# Patient Record
Sex: Male | Born: 1975 | Race: Black or African American | Hispanic: No | Marital: Married | Smoking: Current every day smoker
Health system: Southern US, Community
[De-identification: ages and names within clinical notes are randomized; demographics above are authoritative.]

## PROBLEM LIST (undated history)

## (undated) DIAGNOSIS — E119 Type 2 diabetes mellitus without complications: Secondary | ICD-10-CM

## (undated) DIAGNOSIS — I10 Essential (primary) hypertension: Secondary | ICD-10-CM

---

## 2019-05-28 ENCOUNTER — Other Ambulatory Visit: Payer: Self-pay

## 2019-05-28 ENCOUNTER — Emergency Department: Payer: BC Managed Care – PPO

## 2019-05-28 ENCOUNTER — Emergency Department
Admission: EM | Admit: 2019-05-28 | Discharge: 2019-05-28 | Disposition: A | Discharge status: Home / Self Care | Payer: BC Managed Care – PPO | Attending: Student | Admitting: Student

## 2019-05-28 ENCOUNTER — Encounter: Payer: Self-pay | Admitting: Emergency Medicine

## 2019-05-28 DIAGNOSIS — E119 Type 2 diabetes mellitus without complications: Secondary | ICD-10-CM | POA: Diagnosis not present

## 2019-05-28 DIAGNOSIS — F17210 Nicotine dependence, cigarettes, uncomplicated: Secondary | ICD-10-CM | POA: Insufficient documentation

## 2019-05-28 DIAGNOSIS — N200 Calculus of kidney: Secondary | ICD-10-CM

## 2019-05-28 DIAGNOSIS — I10 Essential (primary) hypertension: Secondary | ICD-10-CM | POA: Diagnosis not present

## 2019-05-28 DIAGNOSIS — Z7984 Long term (current) use of oral hypoglycemic drugs: Secondary | ICD-10-CM | POA: Diagnosis not present

## 2019-05-28 DIAGNOSIS — N179 Acute kidney failure, unspecified: Secondary | ICD-10-CM | POA: Insufficient documentation

## 2019-05-28 DIAGNOSIS — R109 Unspecified abdominal pain: Secondary | ICD-10-CM | POA: Diagnosis present

## 2019-05-28 DIAGNOSIS — Z79899 Other long term (current) drug therapy: Secondary | ICD-10-CM | POA: Insufficient documentation

## 2019-05-28 HISTORY — DX: Essential (primary) hypertension: I10

## 2019-05-28 HISTORY — DX: Type 2 diabetes mellitus without complications: E11.9

## 2019-05-28 LAB — URINALYSIS, COMPLETE (UACMP) WITH MICROSCOPIC
Bilirubin Urine: NEGATIVE
Glucose, UA: NEGATIVE mg/dL
Ketones, ur: 20 mg/dL — AB
Leukocytes,Ua: NEGATIVE
Nitrite: NEGATIVE
Protein, ur: 30 mg/dL — AB
RBC / HPF: >50 RBC/hpf — ABNORMAL HIGH (ref 0–5)
Specific Gravity, Urine: 1.02 (ref 1.005–1.030)
Squamous Epithelial / HPF: NONE SEEN (ref 0–5)
pH: 5 (ref 5.0–8.0)

## 2019-05-28 LAB — COMPREHENSIVE METABOLIC PANEL
ALT: 11 U/L (ref 0–44)
AST: 16 U/L (ref 15–41)
Albumin: 4.5 g/dL (ref 3.5–5.0)
Alkaline Phosphatase: 71 U/L (ref 38–126)
Anion gap: 13 (ref 5–15)
BUN: 20 mg/dL (ref 6–20)
CO2: 27 mmol/L (ref 22–32)
Calcium: 9.7 mg/dL (ref 8.9–10.3)
Chloride: 96 mmol/L — ABNORMAL LOW (ref 98–111)
Creatinine, Ser: 2.6 mg/dL — ABNORMAL HIGH (ref 0.61–1.24)
GFR calc Af Amer: 33 mL/min — ABNORMAL LOW (ref >60)
GFR calc non Af Amer: 29 mL/min — ABNORMAL LOW (ref >60)
Glucose, Bld: 153 mg/dL — ABNORMAL HIGH (ref 70–99)
Potassium: 4.1 mmol/L (ref 3.5–5.1)
Sodium: 136 mmol/L (ref 135–145)
Total Bilirubin: 0.8 mg/dL (ref 0.3–1.2)
Total Protein: 8.9 g/dL — ABNORMAL HIGH (ref 6.5–8.1)

## 2019-05-28 LAB — CBC
HCT: 52.3 % — ABNORMAL HIGH (ref 39.0–52.0)
Hemoglobin: 17.4 g/dL — ABNORMAL HIGH (ref 13.0–17.0)
MCH: 28.3 pg (ref 26.0–34.0)
MCHC: 33.3 g/dL (ref 30.0–36.0)
MCV: 85.2 fL (ref 80.0–100.0)
Platelets: 250 10*3/uL (ref 150–400)
RBC: 6.14 MIL/uL — ABNORMAL HIGH (ref 4.22–5.81)
RDW: 13.9 % (ref 11.5–15.5)
WBC: 14.2 10*3/uL — ABNORMAL HIGH (ref 4.0–10.5)
nRBC: 0 % (ref 0.0–0.2)

## 2019-05-28 LAB — LIPASE, BLOOD: Lipase: 46 U/L (ref 11–51)

## 2019-05-28 LAB — TROPONIN I (HIGH SENSITIVITY): Troponin I (High Sensitivity): 6 ng/L (ref <18)

## 2019-05-28 MED ORDER — FENTANYL CITRATE (PF) 100 MCG/2ML IJ SOLN
50.0000 ug | Freq: Once | INTRAMUSCULAR | Status: AC
  Administered 2019-05-28: 50 ug via INTRAVENOUS
  Filled 2019-05-28: qty 2

## 2019-05-28 MED ORDER — ONDANSETRON HCL 4 MG PO TABS: 4.0000 mg | ORAL_TABLET | Freq: Three times a day (TID) | ORAL | 0 refills | Status: AC | PRN

## 2019-05-28 MED ORDER — ONDANSETRON HCL 4 MG/2ML IJ SOLN
4.0000 mg | Freq: Once | INTRAMUSCULAR | Status: AC
  Administered 2019-05-28: 4 mg via INTRAVENOUS
  Filled 2019-05-28: qty 2

## 2019-05-28 MED ORDER — OXYCODONE HCL 5 MG PO TABS: 5.0000 mg | ORAL_TABLET | Freq: Four times a day (QID) | ORAL | 0 refills | Status: AC | PRN

## 2019-05-28 MED ORDER — MORPHINE SULFATE (PF) 4 MG/ML IV SOLN
4.0000 mg | Freq: Once | INTRAVENOUS | Status: AC
  Administered 2019-05-28: 4 mg via INTRAVENOUS
  Filled 2019-05-28: qty 1

## 2019-05-28 MED ORDER — OXYCODONE HCL 5 MG PO TABS
5.0000 mg | ORAL_TABLET | Freq: Once | ORAL | Status: AC
  Administered 2019-05-28: 5 mg via ORAL
  Filled 2019-05-28: qty 1

## 2019-05-28 MED ORDER — SODIUM CHLORIDE 0.9 % IV BOLUS
2000.0000 mL | Freq: Once | INTRAVENOUS | Status: AC
  Administered 2019-05-28: 2000 mL via INTRAVENOUS

## 2019-05-28 NOTE — ED Provider Notes (Addendum)
[facility] Emergency Department Provider Note  ____________________________________________   First MD Initiated Contact with Patient 05/28/19 1546     (approximate)  I have reviewed the triage vital signs and the nursing notes.  History  Chief Complaint Abdominal Pain    HPI Bruce Brown is a 44 y.o. male with history of DM on Metformin, HTN who presents to the emergency department for left-sided abdominal pain.  Patient states symptoms first started 3 days ago. Initial onset of pain woke him from sleep and was severe and acute at onset.  Located in the LUQ, sharp.  Since then his pain has decreased, feels more like fullness or gas pain, 3/10 in severity.  Located in epigastrium and left upper quadrant and seems to radiate downwards in a curvilinear fashion into the LLQ.  Denies any fevers, dysuria, hematuria.  Has had 2 episodes of vomiting, and 2 episodes of loose stool that he attributes to a MiraLAX that he took when he was initially concerned he might be constipated.  Has been drinking small amounts of ginger ale, but otherwise not managing to take significant amount of solid foods due to his discomfort.  No chest pain or shortness of breath.  No sick contacts or known COVID exposures.  No one else with similar symptoms in the home.  No new foods.  On chart review labs just done on 05/13/2019 revealed a normal creatinine of 1.3.   Past Medical Hx Past Medical History:  Diagnosis Date  . Diabetes mellitus without complication (HCC)   . Hypertension     Problem List There are no problems to display for this patient.   Past Surgical Hx History reviewed. No pertinent surgical history.  Medications Metformin Lisinopril   Allergies Patient has no known allergies.  Family Hx No family history on file.  Social Hx Social History   Tobacco Use  . Smoking status: Current Every Day Smoker    Packs/day: 0.50    Types: Cigarettes  . Smokeless  tobacco: Never Used  Substance Use Topics  . Alcohol use: Yes  . Drug use: Never     Review of Systems  Constitutional: Negative for fever. Negative for chills. Eyes: Negative for visual changes. ENT: Negative for sore throat. Cardiovascular: Negative for chest pain. Respiratory: Negative for shortness of breath. Gastrointestinal: + for nausea. + for vomiting. + abdominal pain, loose stool Genitourinary: Negative for dysuria. Musculoskeletal: Negative for leg swelling. Skin: Negative for rash. Neurological: Negative for headaches.   Physical Exam  Vital Signs: ED Triage Vitals  Enc Vitals Group     BP 05/28/19 1356 (!) 163/100     Pulse Rate 05/28/19 1356 (!) 118     Resp 05/28/19 1356 18     Temp 05/28/19 1356 99.2 F (37.3 C)     Temp Source 05/28/19 1356 Oral     SpO2 05/28/19 1356 99 %     Weight 05/28/19 1356 188 lb (85.3 kg)     Height 05/28/19 1356 5\' 9"  (1.753 m)     Head Circumference --      Peak Flow --      Pain Score 05/28/19 1401 3     Pain Loc --      Pain Edu? --      Excl. in GC? --     Constitutional: Alert and oriented. Well appearing. NAD.  Head: Normocephalic. Atraumatic. Eyes: Conjunctivae clear. Sclera anicteric. Pupils equal and symmetric. Nose: No masses or lesions. No congestion  or rhinorrhea. Mouth/Throat: Wearing mask.  Neck: No stridor. Trachea midline.  Cardiovascular: Normal rate, regular rhythm. Extremities well perfused. Respiratory: Normal respiratory effort.  Lungs CTAB. Gastrointestinal: Soft. Non-distended. Non-tender.  Genitourinary: Deferred. Musculoskeletal: No lower extremity edema. No deformities. Neurologic:  Normal speech and language. No gross focal or lateralizing neurologic deficits are appreciated.  Skin: Skin is warm, dry and intact. No rash noted. Psychiatric: Mood and affect are appropriate for situation.  EKG  Personally reviewed and interpreted by myself.   Rate: 89 Rhythm: sinus Axis:  normal Intervals: WNL No STEMI    Radiology  CT: IMPRESSION:  1. Mild to moderate left-sided hydroureteronephrosis secondary to an obstructing 6 mm stone in the mid left ureter. There are no residual stones remaining in the left kidney.  2. Mild diffuse bladder wall thickening could be seen in setting of cystitis. Correlate with urinalysis.  3. 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     Procedures  Procedure(s) performed (including critical care):  Procedures   Initial Impression / Assessment and Plan / MDM / ED Course  44 y.o. male who presents to the ED for L sided abdominal pain, as above  Ddx: nephrolithiasis, colitis, pancreatitis, gastritis, atypical ACS  Will plan for labs, urine, imaging, EKG. Fluids and pain control.   Clinical Course as of May 28 1839  Tue May 28, 2019  1635 Labs reveal AKI creatinine 2.6 from normal prior. WBC 14.2. CT reveals a L sided stone. Awaiting urine.   [SM]  1732 Troponin negative. Awaiting urine.   [SM]  1821 UA consistent with stone, rare bacteria but no LE or nitrite to suggest infection. Mild ketones c/w dehydration. Discussed with Urology, given normal contralateral kidney, no other interventions needed w/ regard to AKI aside from fluids/rehydration. Will follow up in clinic.    [SM]  1840 Patient's pain has improved, 2/10.  Able to tolerate PO.  As such, will plan for discharge with Rx for pain control and Zofran.  Advised aggressive hydration, hold lisinopril for the next 3 days due to AKI.  Follow-up with urology in clinic.  Patient voices understanding and is comfortable with the plan and discharge.   [SM]    Clinical Course User Index [SM] Lilia Pro., MD     _______________________________   As part of my medical decision making I have reviewed available labs, radiology tests, reviewed old records, obtained additional history from family, and discussed with consultants (urology, Dr.  Bernardo Heater).   Final Clinical Impression(s) / ED Diagnosis  Final diagnoses:  Nephrolithiasis  AKI (acute kidney injury) ([place])       Note:  This document was prepared using Dragon voice recognition software and may include unintentional dictation errors.     Lilia Pro., MD 05/28/19 [phone]

## 2019-05-28 NOTE — ED Triage Notes (Signed)
Pt in via POV with complaints of LUQ abdominal pain since Sunday, some N/V/D but none within the last 24 hours.  NAD noted at this time.

## 2019-05-28 NOTE — ED Notes (Signed)
Pt unable to provide urine specimen at this time

## 2019-05-28 NOTE — Discharge Instructions (Signed)
Thank you for letting us take care of you in the emergency department today.  Your evaluation today shows that you have a kidney stone on the left side.  For the next 3 days, hold your lisinopril blood pressure medication to help with your kidney injury.  Please be sure to stay as well hydrated as possible and drink plenty of fluids.  New medications we have prescribed:  Oxycodone - narcotic pain medication, do not drink, drive, operate heavy machinery, or take any other sedating medications while on this Zofran - nausea medication  Please follow up with: Urology doctor, information below   Please return to the ER for any new or worsening symptoms.

## 2019-06-02 NOTE — H&P (View-Only) (Signed)
06/03/19 12:15 PM   Bruce Brown [birth year] [id number]  [place]erring [place] [address] [place],  [place] [address]  Chief Complaint  Patient presents with  . Hydronephrosis    HPI: 44 y.o. male presents today post-ED visit for the evaluation and management of nephrolithiasis.   -ED visit 05/28/19 c/o LUQ sharp pain onset 3 days prior  -radiates downwards in curvilinear fashion to LLQ  -CT showed mild to moderate left-sided hydroureteronephrosis secondary to obstructing 6 mm stone in proximal left ureter  -urinalysis consistent with stone, rare bacteria  -Creatinine in ED 2.6  -pain control with pain medications (Advil, oxycodone)  -denies F/C/N/V  -no prior hx of stones   PMH: Past Medical History:  Diagnosis Date  . Diabetes mellitus without complication ([place])   . Hypertension     Surgical History: No past surgical history on file.  Home Medications:  Allergies as of 06/03/2019   No Known Allergies     Medication List       Accurate as of June 03, 2019 12:15 PM. If you have any questions, ask your nurse or doctor.        Fifty50 Glucose Meter 2.0 w/Device Kit Use as directed twice daily.   lisinopril-hydrochlorothiazide 20-12.5 MG tablet Commonly known as: ZESTORETIC Take 1 tablet by mouth daily.   metFORMIN 500 MG 24 hr tablet Commonly known as: GLUCOPHAGE-XR Take by mouth.   olmesartan-hydrochlorothiazide 40-12.5 MG tablet Commonly known as: BENICAR HCT Take by mouth.   ondansetron 4 MG tablet Commonly known as: ZOFRAN Take 1 tablet (4 mg total) by mouth every 8 (eight) hours as needed for up to 7 days for nausea or vomiting.   rosuvastatin 5 MG tablet Commonly known as: CRESTOR Take by mouth.   sildenafil 50 MG tablet Commonly known as: VIAGRA Take 50 mg by mouth daily as needed.   Unistik 2 Normal Misc Use 1 each 2 (two) times daily       Allergies: No Known Allergies  Family History: No family  history on file.  Social History:  reports that he has been smoking cigarettes. He has been smoking about 0.50 packs per day. He has never used smokeless tobacco. He reports current alcohol use. He reports that he does not use drugs.   Physical Exam: BP (!) 150/108   Pulse 71   Ht _0  (1.753 m)   Wt 188 lb (85.3 kg)   BMI 27.76 kg/m   Constitutional:  Alert and oriented, No acute distress. HEENT: [place] AT, moist mucus membranes.  Trachea midline, no masses. Cardiovascular: No clubbing, cyanosis, or edema.  RRR Respiratory: Normal respiratory effort, no increased work of breathing.  Clear Skin: No rashes, bruises or suspicious lesions. Neurologic: Grossly intact, no focal deficits, moving all 4 extremities. Psychiatric: Normal mood and affect.  Laboratory Data:  Lab Results  Component Value Date   CREATININE 2.60 (H) 05/28/2019   Pertinent Imaging: CLINICAL DATA:  Left-sided flank pain.  EXAM: CT ABDOMEN AND PELVIS WITHOUT CONTRAST  TECHNIQUE: Multidetector CT imaging of the abdomen and pelvis was performed following the standard protocol without IV contrast.  COMPARISON:  None.  FINDINGS: Lower chest: The lung bases are clear. The heart size is normal.  Hepatobiliary: The liver is normal. Normal gallbladder.There is no biliary ductal dilation.  Pancreas: Normal contours without ductal dilatation. No peripancreatic fluid collection.  Spleen: No splenic laceration or hematoma.  Adrenals/Urinary Tract:  --Adrenal glands: No adrenal hemorrhage.  --  Right kidney/ureter: No hydronephrosis or perinephric hematoma.  --Left kidney/ureter: There is mild to moderate left-sided hydroureteronephrosis secondary to an obstructing 6 mm stone in the mid left ureter (axial series 2, image 41). There are no residual stones remaining in the left.  --Urinary bladder: There is mild diffuse bladder wall thickening.  Stomach/Bowel:  --Stomach/Duodenum: No hiatal  hernia or other gastric abnormality. Normal duodenal course and caliber.  --Small bowel: No dilatation or inflammation.  --Colon: No focal abnormality.  --Appendix: Normal.  Vascular/Lymphatic: Normal course and caliber of the major abdominal vessels.  --No retroperitoneal lymphadenopathy.  --No mesenteric lymphadenopathy.  --No pelvic or inguinal lymphadenopathy.  Reproductive: Unremarkable  Other: No ascites or free air. The abdominal wall is normal.  Musculoskeletal. There is a bilateral pars defect resulting in a 10 mm anterolisthesis of L5 on S1.  IMPRESSION: 1. Mild to moderate left-sided hydroureteronephrosis secondary to an obstructing 6 mm stone in the mid left ureter. There are no residual stones remaining in the left kidney. 2. Mild diffuse bladder wall thickening could be seen in setting of cystitis. Correlate with urinalysis. 3. 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   Electronically Signed   By: Constance Holster M.D.   On: 05/28/2019 16:17  I have personally reviewed the images and agree with radiologist interpretation.   Assessment & Plan:    - Left ureteral stone   CT indicated 6 mm stone in proximal left ureter on my review. Creatinine in ED 2.6, will repeat blood work today  We discussed various treatment options including ESWL vs. ureteroscopy, laser lithotripsy, and stent. We discussed the risks and benefits of both including bleeding, infection, damage to surrounding structures, efficacy with need for possible further intervention, and need for temporary ureteral stent. KUB today to see stone progression  If stone has not progressed, he is interested in ESWL.    Abbie Sons, [place] [address], [place], [place] [address] [phone]  I, Bruce Brown, am acting as a scribe for Dr. Nicki Reaper C. Lashawnta Burgert,  I have reviewed the above  documentation for accuracy and completeness, and I agree with the above.   Abbie Sons, MD

## 2019-06-02 NOTE — Progress Notes (Signed)
06/03/19 12:15 PM   Bruce Brown [birth year] [id number]  [place]erring [place]: [place] [address] [place],  [place] [address]  Chief Complaint  Patient presents with  . Hydronephrosis    HPI: 44 y.o. male presents today post-ED visit for the evaluation and management of nephrolithiasis.   -ED visit 05/28/19 c/o LUQ sharp pain onset 3 days prior  -radiates downwards in curvilinear fashion to LLQ  -CT showed mild to moderate left-sided hydroureteronephrosis secondary to obstructing 6 mm stone in proximal left ureter  -urinalysis consistent with stone, rare bacteria  -Creatinine in ED 2.6  -pain control with pain medications (Advil, oxycodone)  -denies F/C/N/V  -no prior hx of stones   PMH: Past Medical History:  Diagnosis Date  . Diabetes mellitus without complication ([place])   . Hypertension     Surgical History: No past surgical history on file.  Home Medications:  Allergies as of 06/03/2019   No Known Allergies     Medication List       Accurate as of June 03, 2019 12:15 PM. If you have any questions, ask your nurse or doctor.        Fifty50 Glucose Meter 2.0 w/Device Kit Use as directed twice daily.   lisinopril-hydrochlorothiazide 20-12.5 MG tablet Commonly known as: ZESTORETIC Take 1 tablet by mouth daily.   metFORMIN 500 MG 24 hr tablet Commonly known as: GLUCOPHAGE-XR Take by mouth.   olmesartan-hydrochlorothiazide 40-12.5 MG tablet Commonly known as: BENICAR HCT Take by mouth.   ondansetron 4 MG tablet Commonly known as: ZOFRAN Take 1 tablet (4 mg total) by mouth every 8 (eight) hours as needed for up to 7 days for nausea or vomiting.   rosuvastatin 5 MG tablet Commonly known as: CRESTOR Take by mouth.   sildenafil 50 MG tablet Commonly known as: VIAGRA Take 50 mg by mouth daily as needed.   Unistik 2 Normal Misc Use 1 each 2 (two) times daily       Allergies: No Known Allergies  Family History: No family  history on file.  Social History:  reports that he has been smoking cigarettes. He has been smoking about 0.50 packs per day. He has never used smokeless tobacco. He reports current alcohol use. He reports that he does not use drugs.   Physical Exam: BP (!) 150/108   Pulse 71   Ht _0  (1.753 m)   Wt 188 lb (85.3 kg)   BMI 27.76 kg/m   Constitutional:  Alert and oriented, No acute distress. HEENT: [place] AT, moist mucus membranes.  Trachea midline, no masses. Cardiovascular: No clubbing, cyanosis, or edema.  RRR Respiratory: Normal respiratory effort, no increased work of breathing.  Clear Skin: No rashes, bruises or suspicious lesions. Neurologic: Grossly intact, no focal deficits, moving all 4 extremities. Psychiatric: Normal mood and affect.  Laboratory Data:  Lab Results  Component Value Date   CREATININE 2.60 (H) 05/28/2019   Pertinent Imaging: CLINICAL DATA:  Left-sided flank pain.  EXAM: CT ABDOMEN AND PELVIS WITHOUT CONTRAST  TECHNIQUE: Multidetector CT imaging of the abdomen and pelvis was performed following the standard protocol without IV contrast.  COMPARISON:  None.  FINDINGS: Lower chest: The lung bases are clear. The heart size is normal.  Hepatobiliary: The liver is normal. Normal gallbladder.There is no biliary ductal dilation.  Pancreas: Normal contours without ductal dilatation. No peripancreatic fluid collection.  Spleen: No splenic laceration or hematoma.  Adrenals/Urinary Tract:  --Adrenal glands: No adrenal hemorrhage.  --  Right kidney/ureter: No hydronephrosis or perinephric hematoma.  --Left kidney/ureter: There is mild to moderate left-sided hydroureteronephrosis secondary to an obstructing 6 mm stone in the mid left ureter (axial series 2, image 41). There are no residual stones remaining in the left.  --Urinary bladder: There is mild diffuse bladder wall thickening.  Stomach/Bowel:  --Stomach/Duodenum: No hiatal  hernia or other gastric abnormality. Normal duodenal course and caliber.  --Small bowel: No dilatation or inflammation.  --Colon: No focal abnormality.  --Appendix: Normal.  Vascular/Lymphatic: Normal course and caliber of the major abdominal vessels.  --No retroperitoneal lymphadenopathy.  --No mesenteric lymphadenopathy.  --No pelvic or inguinal lymphadenopathy.  Reproductive: Unremarkable  Other: No ascites or free air. The abdominal wall is normal.  Musculoskeletal. There is a bilateral pars defect resulting in a 10 mm anterolisthesis of L5 on S1.  IMPRESSION: 1. Mild to moderate left-sided hydroureteronephrosis secondary to an obstructing 6 mm stone in the mid left ureter. There are no residual stones remaining in the left kidney. 2. Mild diffuse bladder wall thickening could be seen in setting of cystitis. Correlate with urinalysis. 3. 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   Electronically Signed   By: Constance Holster M.D.   On: 05/28/2019 16:17  I have personally reviewed the images and agree with radiologist interpretation.   Assessment & Plan:    - Left ureteral stone   CT indicated 6 mm stone in proximal left ureter on my review. Creatinine in ED 2.6, will repeat blood work today  We discussed various treatment options including ESWL vs. ureteroscopy, laser lithotripsy, and stent. We discussed the risks and benefits of both including bleeding, infection, damage to surrounding structures, efficacy with need for possible further intervention, and need for temporary ureteral stent. KUB today to see stone progression  If stone has not progressed, he is interested in ESWL.    Abbie Sons, [place] [address], [place], [place] [address] [phone]  I, [name], am acting as a scribe for Dr. Nicki Reaper C. Carolyn Sylvia,  I have reviewed the above  documentation for accuracy and completeness, and I agree with the above.   Abbie Sons, MD

## 2019-06-03 ENCOUNTER — Ambulatory Visit
Admission: RE | Admit: 2019-06-03 | Discharge: 2019-06-03 | Disposition: A | Discharge status: Home / Self Care | Payer: BC Managed Care – PPO | Source: Ambulatory Visit | Attending: Urology | Admitting: Urology

## 2019-06-03 ENCOUNTER — Encounter: Payer: Self-pay | Admitting: Urology

## 2019-06-03 ENCOUNTER — Other Ambulatory Visit: Payer: Self-pay

## 2019-06-03 ENCOUNTER — Ambulatory Visit: Payer: BC Managed Care – PPO | Admitting: Urology

## 2019-06-03 VITALS — BP 150/108 | HR 71 | Ht 69.0 in | Wt 188.0 lb

## 2019-06-03 DIAGNOSIS — N1339 Other hydronephrosis: Secondary | ICD-10-CM

## 2019-06-04 LAB — CREATININE, SERUM
Creatinine, Ser: 1.52 mg/dL — ABNORMAL HIGH (ref 0.76–1.27)
GFR calc Af Amer: 63 mL/min/{1.73_m2} (ref >59)
GFR calc non Af Amer: 55 mL/min/{1.73_m2} — ABNORMAL LOW (ref >59)

## 2019-06-05 ENCOUNTER — Other Ambulatory Visit: Payer: Self-pay

## 2019-06-05 ENCOUNTER — Other Ambulatory Visit: Payer: Self-pay | Admitting: Radiology

## 2019-06-05 ENCOUNTER — Other Ambulatory Visit
Admission: RE | Admit: 2019-06-05 | Discharge: 2019-06-05 | Disposition: A | Discharge status: Home / Self Care | Payer: BC Managed Care – PPO | Source: Ambulatory Visit | Attending: Urology | Admitting: Urology

## 2019-06-05 DIAGNOSIS — Z01812 Encounter for preprocedural laboratory examination: Secondary | ICD-10-CM | POA: Diagnosis present

## 2019-06-05 DIAGNOSIS — N1339 Other hydronephrosis: Secondary | ICD-10-CM

## 2019-06-05 DIAGNOSIS — Z20822 Contact with and (suspected) exposure to covid-19: Secondary | ICD-10-CM | POA: Insufficient documentation

## 2019-06-05 DIAGNOSIS — N201 Calculus of ureter: Secondary | ICD-10-CM

## 2019-06-05 LAB — SARS CORONAVIRUS 2 (TAT 6-24 HRS): SARS Coronavirus 2: NEGATIVE

## 2019-06-06 ENCOUNTER — Ambulatory Visit
Admission: RE | Admit: 2019-06-06 | Discharge: 2019-06-06 | Disposition: A | Discharge status: Home / Self Care | Payer: BC Managed Care – PPO | Attending: Urology | Admitting: Urology

## 2019-06-06 ENCOUNTER — Encounter: Payer: Self-pay | Admitting: Urology

## 2019-06-06 ENCOUNTER — Ambulatory Visit: Payer: BC Managed Care – PPO

## 2019-06-06 ENCOUNTER — Encounter
Admission: RE | Disposition: A | Discharge status: Home / Self Care | Payer: Self-pay | Source: Home / Self Care | Attending: Urology

## 2019-06-06 DIAGNOSIS — N132 Hydronephrosis with renal and ureteral calculous obstruction: Secondary | ICD-10-CM | POA: Diagnosis not present

## 2019-06-06 DIAGNOSIS — F17210 Nicotine dependence, cigarettes, uncomplicated: Secondary | ICD-10-CM | POA: Insufficient documentation

## 2019-06-06 DIAGNOSIS — F17200 Nicotine dependence, unspecified, uncomplicated: Secondary | ICD-10-CM | POA: Diagnosis not present

## 2019-06-06 DIAGNOSIS — N1339 Other hydronephrosis: Secondary | ICD-10-CM

## 2019-06-06 DIAGNOSIS — E119 Type 2 diabetes mellitus without complications: Secondary | ICD-10-CM | POA: Insufficient documentation

## 2019-06-06 DIAGNOSIS — I10 Essential (primary) hypertension: Secondary | ICD-10-CM | POA: Insufficient documentation

## 2019-06-06 DIAGNOSIS — N201 Calculus of ureter: Secondary | ICD-10-CM

## 2019-06-06 HISTORY — PX: EXTRACORPOREAL SHOCK WAVE LITHOTRIPSY: SHX1557

## 2019-06-06 LAB — GLUCOSE, CAPILLARY: Glucose-Capillary: 123 mg/dL — ABNORMAL HIGH (ref 70–99)

## 2019-06-06 SURGERY — LITHOTRIPSY, ESWL
Anesthesia: Moderate Sedation | Laterality: Left

## 2019-06-06 MED ORDER — HYDROCODONE-ACETAMINOPHEN 5-325 MG PO TABS: 1.0000 | ORAL_TABLET | Freq: Four times a day (QID) | ORAL | 0 refills | Status: DC | PRN

## 2019-06-06 MED ORDER — CIPROFLOXACIN HCL 500 MG PO TABS
ORAL_TABLET | ORAL | Status: AC
  Filled 2019-06-06: qty 1

## 2019-06-06 MED ORDER — ONDANSETRON HCL 4 MG/2ML IJ SOLN: 4.0000 mg | Freq: Once | INTRAMUSCULAR | Status: AC | PRN

## 2019-06-06 MED ORDER — DIAZEPAM 5 MG PO TABS
ORAL_TABLET | ORAL | Status: AC
  Filled 2019-06-06: qty 2

## 2019-06-06 MED ORDER — DIPHENHYDRAMINE HCL 25 MG PO CAPS
25.0000 mg | ORAL_CAPSULE | ORAL | Status: AC
  Administered 2019-06-06: 25 mg via ORAL

## 2019-06-06 MED ORDER — DIPHENHYDRAMINE HCL 25 MG PO CAPS
ORAL_CAPSULE | ORAL | Status: AC
  Filled 2019-06-06: qty 1

## 2019-06-06 MED ORDER — ONDANSETRON HCL 4 MG/2ML IJ SOLN
INTRAMUSCULAR | Status: AC
  Administered 2019-06-06: 4 mg via INTRAVENOUS
  Filled 2019-06-06: qty 2

## 2019-06-06 MED ORDER — TAMSULOSIN HCL 0.4 MG PO CAPS: 0.4000 mg | ORAL_CAPSULE | Freq: Every day | ORAL | 0 refills | Status: AC

## 2019-06-06 MED ORDER — DIAZEPAM 5 MG PO TABS
10.0000 mg | ORAL_TABLET | ORAL | Status: AC
  Administered 2019-06-06: 10 mg via ORAL

## 2019-06-06 MED ORDER — SODIUM CHLORIDE 0.9 % IV SOLN: INTRAVENOUS | Status: DC

## 2019-06-06 MED ORDER — CIPROFLOXACIN HCL 500 MG PO TABS
500.0000 mg | ORAL_TABLET | ORAL | Status: AC
  Administered 2019-06-06: 500 mg via ORAL

## 2019-06-06 NOTE — Interval H&P Note (Signed)
History and Physical Interval Note:  06/06/2019 8:40 AM  Bruce Brown  has presented today for surgery, with the diagnosis of Kidney stone.  The various methods of treatment have been discussed with the patient and family. After consideration of risks, benefits and other options for treatment, the patient has consented to  Procedure(s): EXTRACORPOREAL SHOCK WAVE LITHOTRIPSY (ESWL) (Left) as a surgical intervention.  The patient's history has been reviewed, patient examined, no change in status, stable for surgery.  I have reviewed the patient's chart and labs.  Questions were answered to the patient's satisfaction.     Vanna Scotland

## 2019-06-06 NOTE — Discharge Instructions (Signed)
See [facility] discharge instructions in chart.  AMBULATORY SURGERY  DISCHARGE INSTRUCTIONS   1) The drugs that you were given will stay in your system until tomorrow so for the next 24 hours you should not:  A) Drive an automobile B) Make any legal decisions C) Drink any alcoholic beverage   2) You may resume regular meals tomorrow.  Today it is better to start with liquids and gradually work up to solid foods.  You may eat anything you prefer, but it is better to start with liquids, then soup and crackers, and gradually work up to solid foods.   3) Please notify your doctor immediately if you have any unusual bleeding, trouble breathing, redness and pain at the surgery site, drainage, fever, or pain not relieved by medication.    4) Additional Instructions:        Please contact your physician with any problems or Same Day Surgery at [phone], Monday through Friday 6 am to 4 pm, or [facility] [place] Main number at [phone].  

## 2019-06-06 NOTE — Progress Notes (Signed)
BP elevated upon arrival to post op 154/114.  Pt states he took his BP med this morning. Elevated on admission this morning and  BP in office on 06/03/19 was 150/108.  After clarification pt states he was supposed to pick up a new BP med and start it a few weeks ago but hasnt.  Wife aware of need to start new BP med and follow up with PCP if no improvement has BP cuff at home and will keep a check.  BP at d/c 145/103.

## 2019-06-20 ENCOUNTER — Ambulatory Visit
Admission: RE | Admit: 2019-06-20 | Discharge: 2019-06-20 | Disposition: A | Discharge status: Home / Self Care | Payer: BC Managed Care – PPO | Source: Ambulatory Visit | Attending: Urology | Admitting: Urology

## 2019-06-20 ENCOUNTER — Encounter: Payer: Self-pay | Admitting: Urology

## 2019-06-20 ENCOUNTER — Other Ambulatory Visit: Payer: Self-pay

## 2019-06-20 ENCOUNTER — Ambulatory Visit (INDEPENDENT_AMBULATORY_CARE_PROVIDER_SITE_OTHER): Payer: BC Managed Care – PPO | Admitting: Urology

## 2019-06-20 VITALS — BP 144/88 | HR 77 | Ht 69.0 in | Wt 182.0 lb

## 2019-06-20 DIAGNOSIS — N201 Calculus of ureter: Secondary | ICD-10-CM | POA: Diagnosis present

## 2019-06-20 DIAGNOSIS — N132 Hydronephrosis with renal and ureteral calculous obstruction: Secondary | ICD-10-CM

## 2019-06-20 DIAGNOSIS — R3129 Other microscopic hematuria: Secondary | ICD-10-CM

## 2019-06-20 NOTE — Progress Notes (Signed)
06/20/2019 2:02 PM   Bruce Brown 09-21-1975 [id number]  Referring [place]: [place] [address] [place],  [place] [address]  Chief Complaint  Patient presents with  . Nephrolithiasis    HPI: Bruce Brown is a 44 year old male with nephrolithiasis who presents today after ESWL.  Non-contrast CT scan 05/28/2019 mild to moderate left-sided hydroureteronephrosis secondary to an obstructing 6 mm stone in the mid left ureter. There are no residual stones remaining in the left kidney.  Mild diffuse bladder wall thickening could be seen in setting of cystitis. Correlate with urinalysis.  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  UA > 50 RBC's.    He underwent left ESWL on 06/06/2019 with Dr. Erlene Quan.  He subsequently passed the stone in a public toilet and was not able to retrieve it.     KUB 06/20/2019 Previously seen left ureteral calculus is no longer evident.  Today, Patient denies any modifying or aggravating factors.  Patient denies any gross hematuria, dysuria or suprapubic/flank pain.  Patient denies any fevers, chills, nausea or vomiting.     PMH: Past Medical History:  Diagnosis Date  . Diabetes mellitus without complication ([place])   . Hypertension     Surgical History: Past Surgical History:  Procedure Laterality Date  . EXTRACORPOREAL SHOCK WAVE LITHOTRIPSY Left 06/06/2019   Procedure: EXTRACORPOREAL SHOCK WAVE LITHOTRIPSY (ESWL);  Surgeon: Hollice Espy, MD;  Location: [facility] ORS;  Service: Urology;  Laterality: Left;    Home Medications:  Allergies as of 06/20/2019   No Known Allergies     Medication List       Accurate as of June 20, 2019  2:02 PM. If you have any questions, ask your nurse or doctor.        STOP taking these medications   HYDROcodone-acetaminophen 5-325 MG tablet Commonly known as: NORCO/VICODIN Stopped by: Zara Council, PA-C     TAKE these medications   Fifty50 Glucose Meter  2.0 w/Device Kit Use as directed twice daily.   lisinopril-hydrochlorothiazide 20-12.5 MG tablet Commonly known as: ZESTORETIC Take 1 tablet by mouth daily.   metFORMIN 500 MG 24 hr tablet Commonly known as: GLUCOPHAGE-XR Take by mouth.   olmesartan-hydrochlorothiazide 40-12.5 MG tablet Commonly known as: BENICAR HCT Take by mouth.   rosuvastatin 5 MG tablet Commonly known as: CRESTOR Take by mouth.   sildenafil 50 MG tablet Commonly known as: VIAGRA Take 50 mg by mouth daily as needed.   tamsulosin 0.4 MG Caps capsule Commonly known as: Flomax Take 1 capsule (0.4 mg total) by mouth daily.   Unistik 2 Normal Misc Use 1 each 2 (two) times daily       Allergies: No Known Allergies  Family History: History reviewed. No pertinent family history.  Social History:  reports that he has been smoking cigarettes. He has been smoking about 0.50 packs per day. He has never used smokeless tobacco. He reports current alcohol use. He reports that he does not use drugs.  ROS: Pertinent ROS in HPI  Physical Exam: BP (!) 144/88   Pulse 77   Ht 5' 9"  (1.753 m)   Wt 182 lb (82.6 kg)   BMI 26.88 kg/m   Constitutional:  Well nourished. Alert and oriented, No acute distress. HEENT: [place] AT, mask in place.  Trachea midline. Cardiovascular: No clubbing, cyanosis, or edema. Respiratory: Normal respiratory effort, no increased work of breathing. GI: Abdomen is soft, non tender, non  distended, no abdominal masses.  Neurologic: Grossly intact, no focal deficits, moving all 4 extremities. Psychiatric: Normal mood and affect.  Laboratory Data: Lab Results  Component Value Date   WBC 14.2 (H) 05/28/2019   HGB 17.4 (H) 05/28/2019   HCT 52.3 (H) 05/28/2019   MCV 85.2 05/28/2019   PLT 250 05/28/2019    Lab Results  Component Value Date   CREATININE 1.52 (H) 06/03/2019    Lab Results  Component Value Date   AST 16 05/28/2019   Lab Results  Component Value Date   ALT 11  05/28/2019    Urinalysis    Component Value Date/Time   COLORURINE YELLOW (A) 05/28/2019 1739   APPEARANCEUR HAZY (A) 05/28/2019 1739   LABSPEC 1.020 05/28/2019 1739   PHURINE 5.0 05/28/2019 1739   GLUCOSEU NEGATIVE 05/28/2019 1739   HGBUR LARGE (A) 05/28/2019 1739   BILIRUBINUR NEGATIVE 05/28/2019 1739   KETONESUR 20 (A) 05/28/2019 1739   PROTEINUR 30 (A) 05/28/2019 1739   NITRITE NEGATIVE 05/28/2019 1739   LEUKOCYTESUR NEGATIVE 05/28/2019 1739    I have reviewed the labs.   Pertinent Imaging: CLINICAL DATA:  Nephrolithiasis. Lithotripsy 06/06/2019  EXAM: ABDOMEN - 1 VIEW  COMPARISON:  06/06/2019  FINDINGS: The bowel gas pattern is normal. Previously seen left ureteral calculus is no longer evident. No stones are seen overlying the renal shadows, along the course of the bilateral ureters, or overlying the bladder. Unchanged left pelvic phlebolith.  IMPRESSION: Previously seen left ureteral calculus is no longer evident.   Electronically Signed   By: Davina Poke D.O.   On: 06/20/2019 16:22 I have independently reviewed the films and do not identify the previous left mid ureteral stone.    Assessment & Plan:    1. Left ureteral stone - s/p left ESWL - no stone seen on today's KUB - given booklet on ABC's of stone prevention - given samples of LithoLyte - would like to consider 24 hour metabolic workup, discuss further at upcoming appointment  2. Left hydronephrosis - obtain RUS to ensure the hydronephrosis has resolved once they have passed and/or recovered from procedure to ensure to iatrogenic hydronephrosis remains - it is explained to the patient that it is important to document resolution of the hydronephrosis as "silent hydronephrosis" can occur and cause damage and/or loss of the kidney  3. Microscopic hematuria - continue to monitor the patient's UA after the treatment/passage of the stone to ensure the hematuria has resolved - if hematuria  persists, we will pursue a hematuria workup with CT Urogram and cystoscopy if appropriate.   Return in about 1 month (around 07/20/2019) for RUS report and UA .  These notes generated with voice recognition software. I apologize for typographical errors.  Zara Council, PA-C  [facility] [address]  [place], [place] [address] [phone]

## 2019-07-12 ENCOUNTER — Encounter: Payer: Self-pay | Admitting: Urology

## 2019-07-12 ENCOUNTER — Ambulatory Visit: Payer: BC Managed Care – PPO | Admitting: Urology

## 2019-08-01 ENCOUNTER — Telehealth: Payer: Self-pay | Admitting: Urology

## 2019-08-01 NOTE — Telephone Encounter (Signed)
Would you call Bruce Brown and have him reschedule his missed renal ultrasound?

## 2019-08-02 NOTE — Telephone Encounter (Signed)
LMOM to return call to schedule RUS and follow up results appt.

## 2019-08-08 ENCOUNTER — Encounter: Payer: Self-pay | Admitting: Urology

## 2019-08-09 NOTE — Telephone Encounter (Signed)
LMOM to return call.

## 2019-10-06 ENCOUNTER — Encounter: Payer: Self-pay | Admitting: Urology

## 2022-03-18 IMAGING — CR DG ABDOMEN 1V
1 series · 1 of 1 positions shown · non-contrast
Comparison: 06/06/2019

CLINICAL DATA: Nephrolithiasis. Lithotripsy 06/06/2019

EXAM:
ABDOMEN - 1 VIEW

[dg abd 1 view]
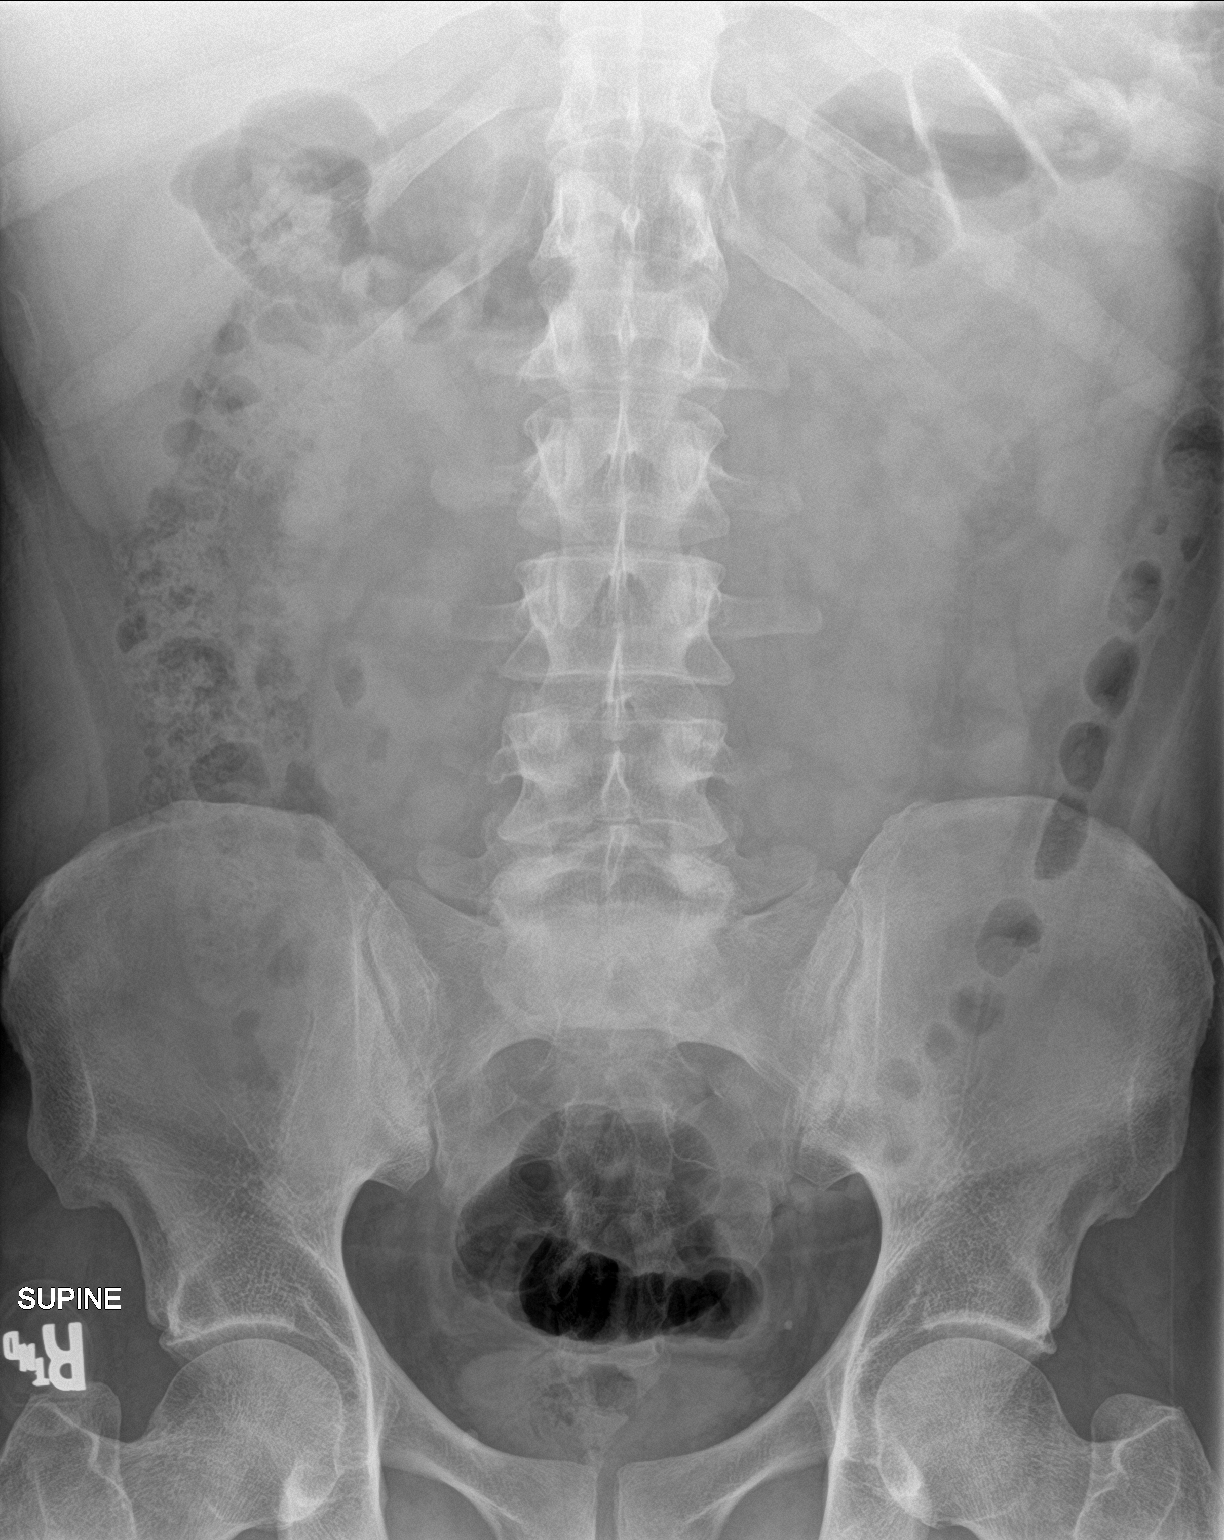

[1 of 1 positions shown; findings below may reference images not displayed]

FINDINGS: The bowel gas pattern is normal. Previously seen left ureteral
calculus is no longer evident. No stones are seen overlying the
renal shadows, along the course of the bilateral ureters, or
overlying the bladder. Unchanged left pelvic phlebolith.
IMPRESSION: Previously seen left ureteral calculus is no longer evident.
